# Patient Record
Sex: Female | Born: 2000 | Race: White | Hispanic: No | Marital: Single | Smoking: Never smoker
Health system: Southern US, Community
[De-identification: ages and names within clinical notes are randomized; demographics above are authoritative.]

## PROBLEM LIST (undated history)

## (undated) DIAGNOSIS — R011 Cardiac murmur, unspecified: Secondary | ICD-10-CM

## (undated) HISTORY — PX: NO PAST SURGERIES: SHX2092

## (undated) HISTORY — DX: Cardiac murmur, unspecified: R01.1

---
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
## 2020-01-22 IMAGING — US US ABDOMEN LIMITED
1 series · 14 of 25 positions shown · non-contrast
Comparison: None.

CLINICAL DATA: Right upper quadrant abdominal pain.

EXAM:
ULTRASOUND ABDOMEN LIMITED RIGHT UPPER QUADRANT

[Series 1: us abdomen limited · 0.20mm/px · 14 of 41 slices shown]
[im 1/41]
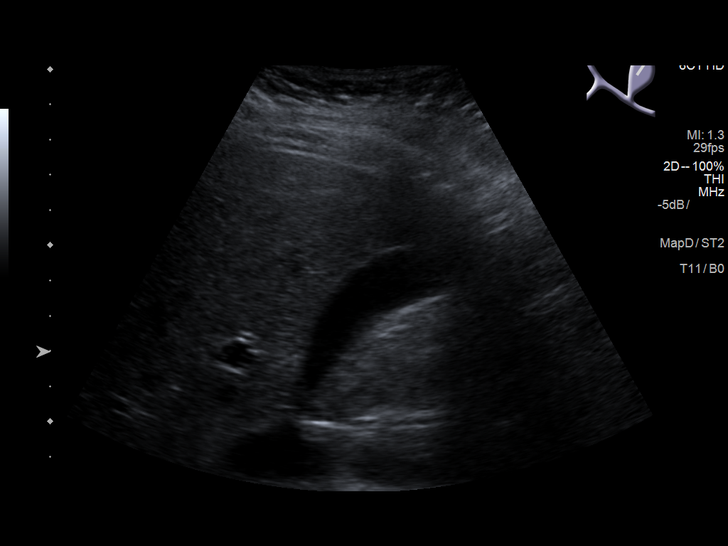
[im 4/41]
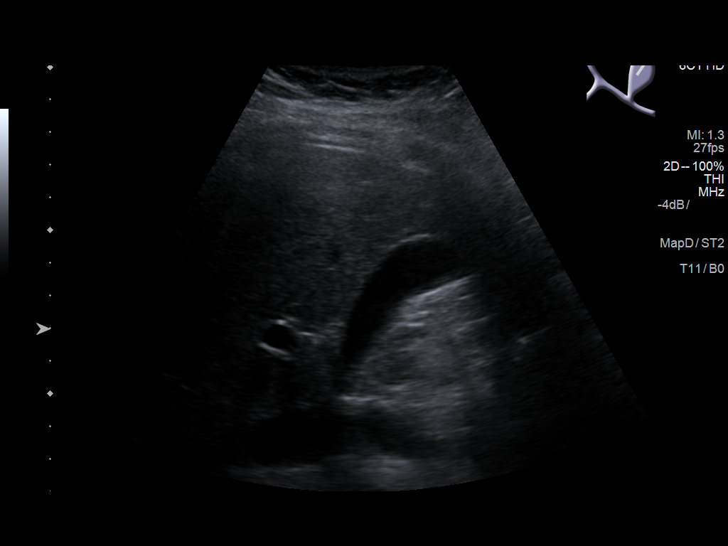
[im 7/41]
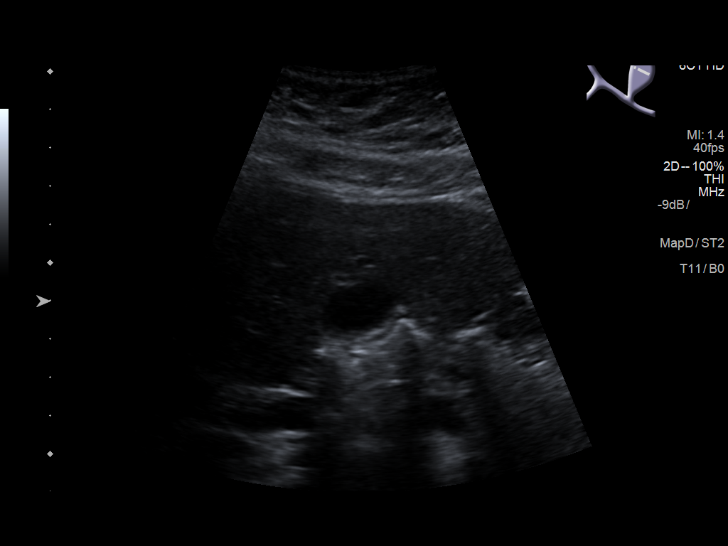
[im 11/41]
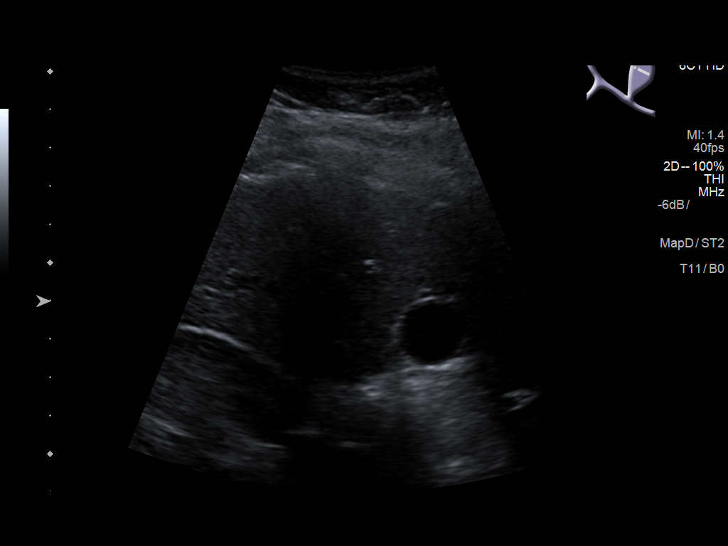
[im 14/41]
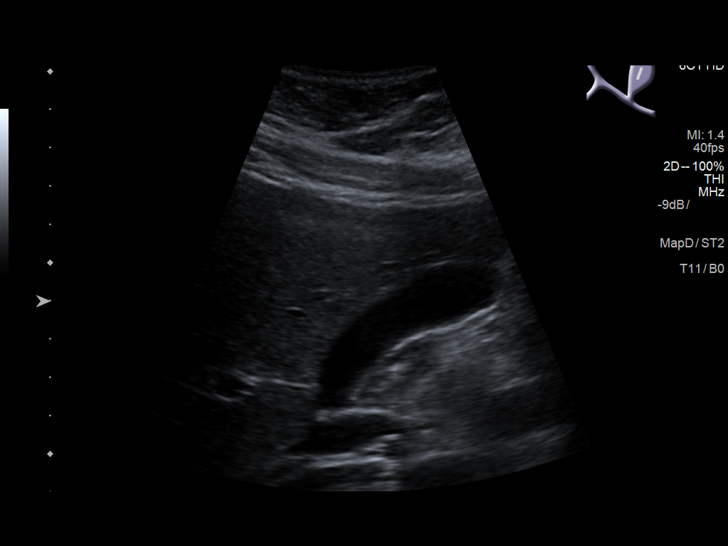
[im 16/41]
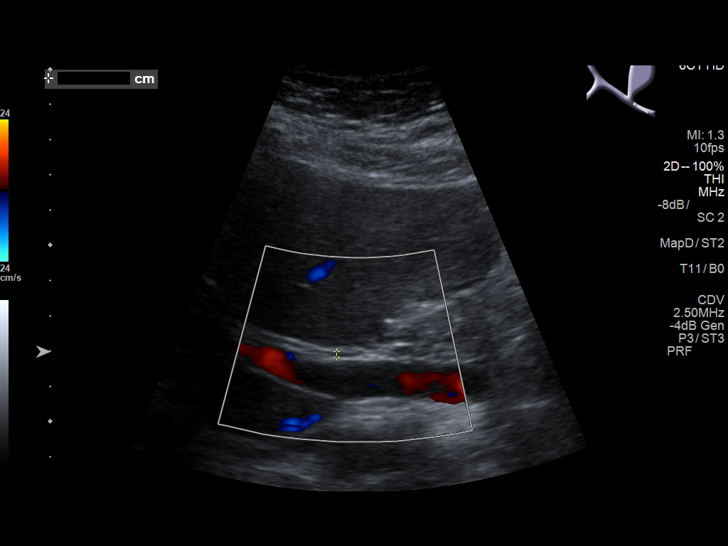
[im 19/41]
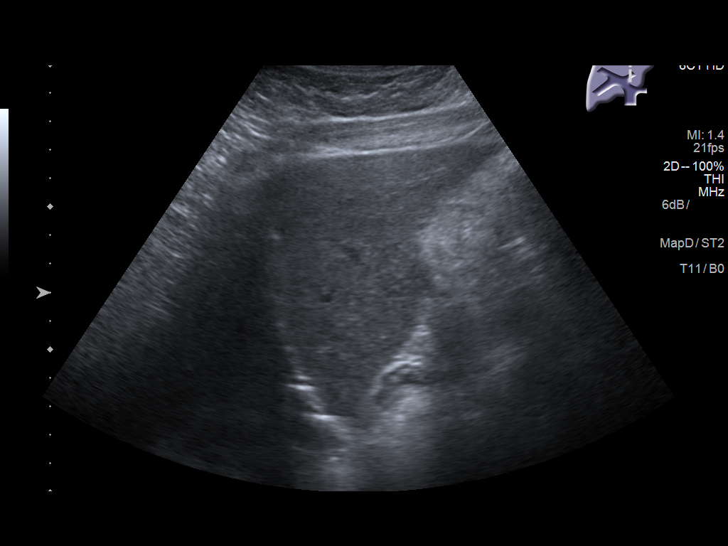
[im 22/41]
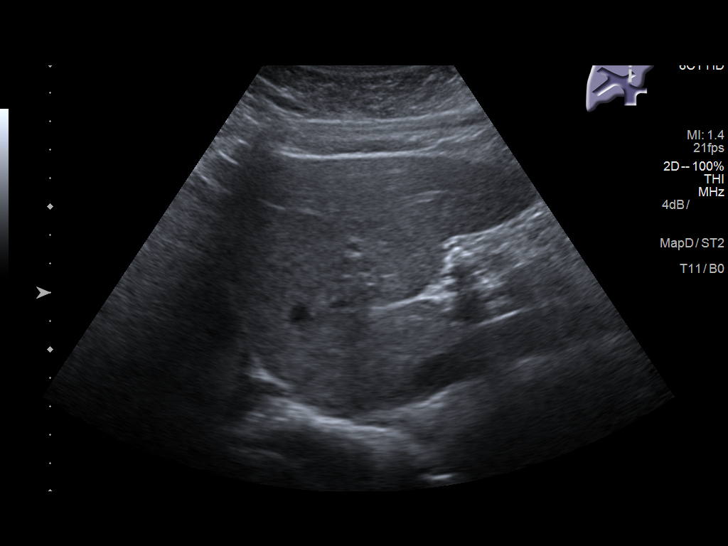
[im 26/41]
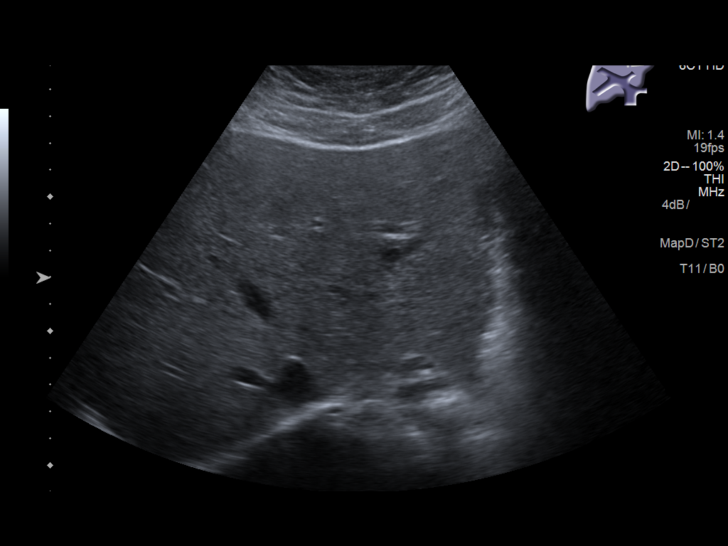
[im 27/41]
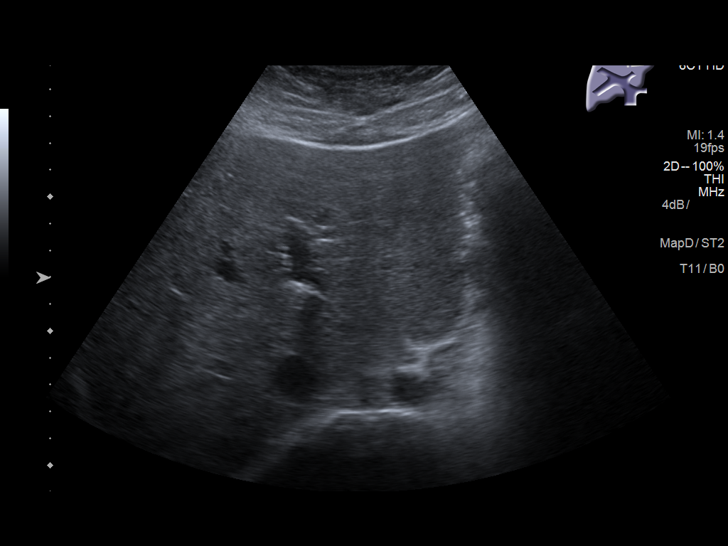
[im 31/41]
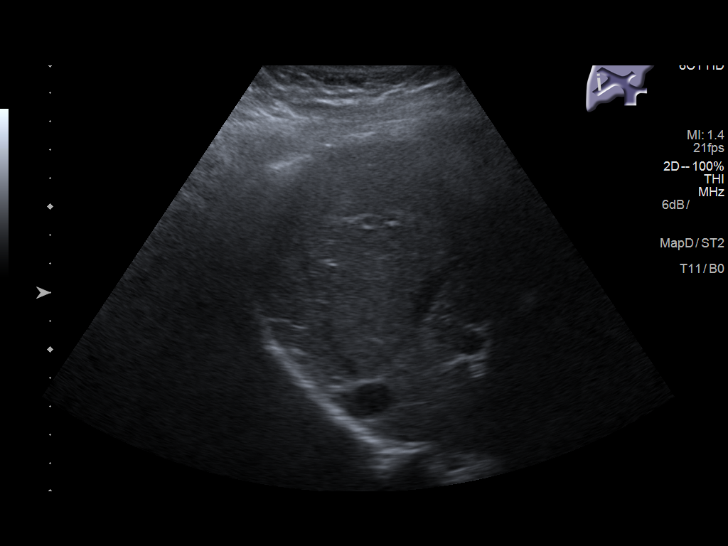
[im 34/41]
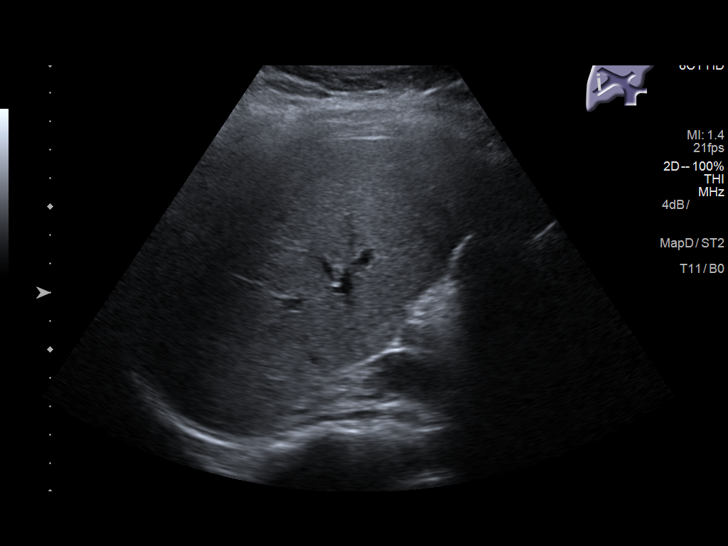
[im 37/41]
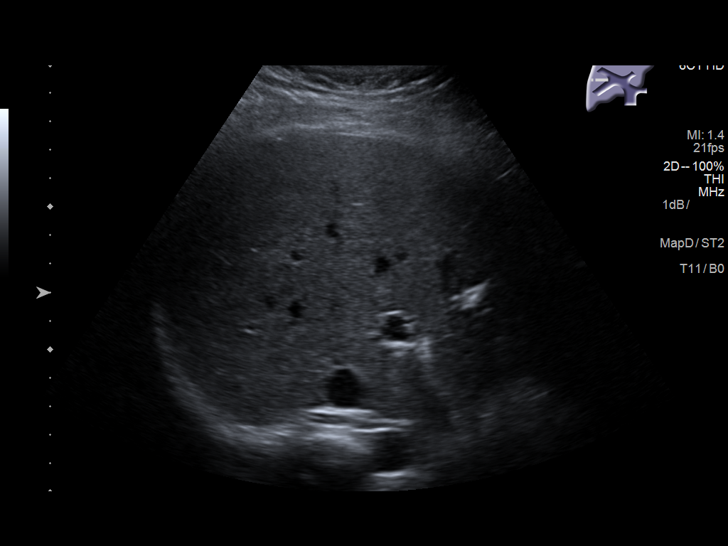
[im 41/41]
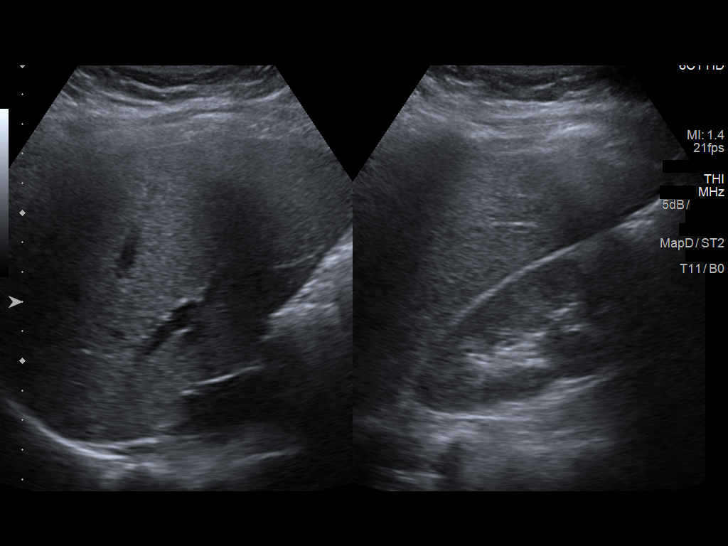

[14 of 25 positions shown; findings below may reference images not displayed]

FINDINGS: Gallbladder:

No gallstones or wall thickening visualized. No sonographic Murphy
sign noted by sonographer.

Common bile duct:

Diameter: 2 mm which is within normal limits.

Liver:

No focal lesion identified. Within normal limits in parenchymal
echogenicity. Portal vein is patent on color Doppler imaging with
normal direction of blood flow towards the liver.
IMPRESSION: No abnormality seen in the right upper quadrant of the abdomen.

## 2020-08-27 ENCOUNTER — Encounter: Payer: Self-pay | Admitting: Obstetrics and Gynecology

## 2020-08-27 ENCOUNTER — Ambulatory Visit (INDEPENDENT_AMBULATORY_CARE_PROVIDER_SITE_OTHER): Payer: Medicaid Other | Admitting: Obstetrics and Gynecology

## 2020-08-27 ENCOUNTER — Other Ambulatory Visit (HOSPITAL_COMMUNITY)
Admission: RE | Admit: 2020-08-27 | Discharge: 2020-08-27 | Disposition: A | Discharge status: Home / Self Care | Payer: Medicaid Other | Source: Ambulatory Visit | Attending: Obstetrics and Gynecology | Admitting: Obstetrics and Gynecology

## 2020-08-27 ENCOUNTER — Other Ambulatory Visit: Payer: Self-pay

## 2020-08-27 VITALS — BP 122/74 | Wt 205.0 lb

## 2020-08-27 DIAGNOSIS — O99212 Obesity complicating pregnancy, second trimester: Secondary | ICD-10-CM

## 2020-08-27 DIAGNOSIS — Z113 Encounter for screening for infections with a predominantly sexual mode of transmission: Secondary | ICD-10-CM | POA: Insufficient documentation

## 2020-08-27 DIAGNOSIS — Z3A20 20 weeks gestation of pregnancy: Secondary | ICD-10-CM

## 2020-08-27 DIAGNOSIS — O0990 Supervision of high risk pregnancy, unspecified, unspecified trimester: Secondary | ICD-10-CM | POA: Diagnosis present

## 2020-08-27 DIAGNOSIS — O0932 Supervision of pregnancy with insufficient antenatal care, second trimester: Secondary | ICD-10-CM

## 2020-08-27 NOTE — Progress Notes (Signed)
New Obstetric Patient H&P   Chief Complaint: "Desires prenatal care"  History of Present Illness: Patient is a 19 y.o. G1P0 Not Hispanic or Latino female, LMP 03/04/2020 presents with amenorrhea and positive home pregnancy test. Per patient report, based on a 12 week ultrasound ([redacted]w[redacted]d on 07/04/2020) her EDD is Estimated Date of Delivery: 01/13/21 and her EGA is [redacted]w[redacted]d.   She had one visit in [place] with Dr. Genoveva Ill in [place] at [facility].  She was [redacted]w[redacted]d pregnant when she saw him.    Since her LMP she claims she has experienced no issues. She denies vaginal bleeding. Her past medical history is noncontributory. This is her first pregnancy.  Since her LMP, she admits to the use of tobacco products  no She claims she has gained 28 pounds since the start of her pregnancy.  There are cats in the home in the home  yes If yes indoor and outdoor. Her fiance changes the litter. She admits close contact with children on a regular basis  yes  She has had chicken pox in the past no She has had Tuberculosis exposures, symptoms, or previously tested positive for TB   no Current or past history of domestic violence. no  Genetic Screening/Teratology Counseling: (Includes patient, baby's father, or anyone in either family with:)   1. Patient's age >/= 99 at [facility]  no 2. Thalassemia ([place], [place], Mediterranean, or Asian background): MCV<80  no 3. Neural tube defect (meningomyelocele, spina bifida, anencephaly)  no 4. Congenital heart defect heart murmurs run in the family, she has never been told why (this is also in the family)  5. Down syndrome  no 6. Tay-Sachs (Jewish, [place]  no 7. Canavan's Disease  no 8. Sickle cell disease or trait (African)  no  9. Hemophilia or other blood disorders  no  10. Muscular dystrophy  no  11. Cystic fibrosis  no  12. Huntington's Chorea  no  13. Mental retardation/autism  no 14. Other inherited genetic or chromosomal disorder   no 15. Maternal metabolic disorder (DM, PKU, etc)  no 16. Patient or FOB with a child with a birth defect not listed above no  16a. Patient or FOB with a birth defect themselves no 17. Recurrent pregnancy loss, or stillbirth  no  18. Any medications since LMP other than prenatal vitamins (include vitamins, supplements, OTC meds, drugs, alcohol)  no 19. Any other genetic/environmental exposure to discuss  no  Infection History:   1. Lives with someone with TB or TB exposed  no  2. Patient or partner has history of genital herpes  no 3. Rash or viral illness since LMP  no 4. History of STI (GC, CT, HPV, syphilis, HIV)  no 5. History of recent travel :  no  Other pertinent information:     Review of Systems:10 point review of systems negative unless otherwise noted in HPI  Past Medical History:  Diagnosis Date  . Heart murmur     Past Surgical History:  Procedure Laterality Date  . NO PAST SURGERIES     Gynecologic History: Patient's last menstrual period was 03/04/2020.  Obstetric History: G1P0  Family History  Problem Relation Age of Onset  . Heart murmur Father   . Hypertension Paternal Grandmother     Social History   Socioeconomic History  . Marital status: Single    Spouse name: Not on file  . Number of children: Not on file  . Years of education: Not on  file  . Highest education level: Not on file  Occupational History  . Not on file  Tobacco Use  . Smoking status: Never Smoker  . Smokeless tobacco: Never Used  Vaping Use  . Vaping Use: Never used  Substance and Sexual Activity  . Alcohol use: No  . Drug use: No  . Sexual activity: Yes    Birth control/protection: None  Other Topics Concern  . Not on file  Social History Narrative  . Not on file   Social Determinants of Health   Financial Resource Strain:   . Difficulty of Paying Living Expenses: Not on file  Food Insecurity:   . Worried About Programme researcher, broadcasting/film/video in the Last Year: Not on file   . Ran Out of Food in the Last Year: Not on file  Transportation Needs:   . Lack of Transportation (Medical): Not on file  . Lack of Transportation (Non-Medical): Not on file  Physical Activity:   . Days of Exercise per Week: Not on file  . Minutes of Exercise per Session: Not on file  Stress:   . Feeling of Stress : Not on file  Social Connections:   . Frequency of Communication with Friends and Family: Not on file  . Frequency of Social Gatherings with Friends and Family: Not on file  . Attends Religious Services: Not on file  . Active Member of Clubs or Organizations: Not on file  . Attends Banker Meetings: Not on file  . Marital Status: Not on file  Intimate Partner Violence:   . Fear of Current or Ex-Partner: Not on file  . Emotionally Abused: Not on file  . Physically Abused: Not on file  . Sexually Abused: Not on file   Allergies: No Known Allergies  Prior to Admission medications: PNV    Physical Exam BP 122/74   Wt 205 lb (93 kg)   LMP 03/04/2020   Physical Exam Constitutional:      General: She is not in acute distress.    Appearance: Normal appearance. She is well-developed.  Genitourinary:     Pelvic exam was performed with patient in the lithotomy position.     Vulva, inguinal canal, urethra, bladder, vagina, uterus, right adnexa and left adnexa normal.     No posterior fourchette tenderness, injury or lesion present.     No cervical friability, lesion, bleeding or polyp.  HENT:     Head: Normocephalic and atraumatic.  Eyes:     General: No scleral icterus.    Conjunctiva/sclera: Conjunctivae normal.  Cardiovascular:     Rate and Rhythm: Normal rate and regular rhythm.     Heart sounds: No murmur heard.  No friction rub. No gallop.   Pulmonary:     Effort: Pulmonary effort is normal. No respiratory distress.     Breath sounds: Normal breath sounds. No wheezing or rales.  Abdominal:     General: Bowel sounds are normal. There is no  distension.     Palpations: Abdomen is soft. There is mass (gravit, fundus at umbilicus).     Tenderness: There is no abdominal tenderness. There is no guarding or rebound.  Musculoskeletal:        General: Normal range of motion.     Cervical back: Normal range of motion and neck supple.  Neurological:     General: No focal deficit present.     Mental Status: She is alert and oriented to person, place, and time.     Cranial  Nerves: No cranial nerve deficit.  Skin:    General: Skin is warm and dry.     Findings: No erythema.  Psychiatric:        Mood and Affect: Mood normal.        Behavior: Behavior normal.        Judgment: Judgment normal.      Female Chaperone present during breast and/or pelvic exam.   Assessment: 19 y.o. G1P0 at [redacted]w[redacted]d presenting to initiate prenatal care  Plan: 1) Avoid alcoholic beverages. 2) Patient encouraged not to smoke.  3) Discontinue the use of all non-medicinal drugs and chemicals.  4) Take prenatal vitamins daily.  5) Nutrition, food safety (fish, cheese advisories, and high nitrite foods) and exercise discussed. 6) Hospital and practice style discussed with cross coverage system.  7) Genetic Screening, such as with 1st Trimester Screening, cell free fetal DNA, AFP testing, and Ultrasound, as well as with amniocentesis and CVS as appropriate, is discussed with patient. At the conclusion of today's visit patient states she has already had genetic testing. Discussed carrier testing and she is interested in this. Will verify genetic screening testing once we receive records from her provider in [place].  8) Patient is asked about travel to areas at risk for the Zika virus, and counseled to avoid travel and exposure to mosquitoes or sexual partners who may have themselves been exposed to the virus. Testing is discussed, and will be ordered as appropriate.   Thomasene Mohair, MD 08/27/2020 8:43 AM

## 2020-08-28 LAB — CERVICOVAGINAL ANCILLARY ONLY
Chlamydia: NEGATIVE
Comment: NEGATIVE
Comment: NEGATIVE
Comment: NORMAL
Neisseria Gonorrhea: NEGATIVE
Trichomonas: NEGATIVE

## 2020-09-02 ENCOUNTER — Ambulatory Visit: Payer: Medicaid Other

## 2020-09-02 ENCOUNTER — Encounter: Payer: Medicaid Other | Admitting: Obstetrics and Gynecology

## 2020-09-02 DIAGNOSIS — O0932 Supervision of pregnancy with insufficient antenatal care, second trimester: Secondary | ICD-10-CM

## 2020-09-02 DIAGNOSIS — O99212 Obesity complicating pregnancy, second trimester: Secondary | ICD-10-CM

## 2020-09-02 DIAGNOSIS — O0990 Supervision of high risk pregnancy, unspecified, unspecified trimester: Secondary | ICD-10-CM

## 2020-09-04 LAB — URINE DRUG PANEL 7
Amphetamines, Urine: NEGATIVE ng/mL
Barbiturate Quant, Ur: NEGATIVE ng/mL
Benzodiazepine Quant, Ur: NEGATIVE ng/mL
Cannabinoid Quant, Ur: POSITIVE — AB
Cocaine (Metab.): NEGATIVE ng/mL
Opiate Quant, Ur: NEGATIVE ng/mL
PCP Quant, Ur: NEGATIVE ng/mL

## 2020-09-04 LAB — URINE CULTURE

## 2020-09-18 ENCOUNTER — Other Ambulatory Visit: Payer: Self-pay | Admitting: Obstetrics and Gynecology

## 2020-09-18 ENCOUNTER — Other Ambulatory Visit: Payer: Self-pay

## 2020-09-18 ENCOUNTER — Ambulatory Visit (INDEPENDENT_AMBULATORY_CARE_PROVIDER_SITE_OTHER): Payer: Medicaid Other | Admitting: Obstetrics

## 2020-09-18 ENCOUNTER — Ambulatory Visit (INDEPENDENT_AMBULATORY_CARE_PROVIDER_SITE_OTHER): Payer: Medicaid Other

## 2020-09-18 VITALS — BP 126/74 | Wt 205.0 lb

## 2020-09-18 DIAGNOSIS — O0990 Supervision of high risk pregnancy, unspecified, unspecified trimester: Secondary | ICD-10-CM

## 2020-09-18 DIAGNOSIS — O99212 Obesity complicating pregnancy, second trimester: Secondary | ICD-10-CM | POA: Diagnosis not present

## 2020-09-18 DIAGNOSIS — Z3686 Encounter for antenatal screening for cervical length: Secondary | ICD-10-CM

## 2020-09-18 DIAGNOSIS — Z3A25 25 weeks gestation of pregnancy: Secondary | ICD-10-CM

## 2020-09-18 DIAGNOSIS — O0932 Supervision of pregnancy with insufficient antenatal care, second trimester: Secondary | ICD-10-CM

## 2020-09-18 NOTE — Progress Notes (Signed)
Routine Prenatal Care Visit  Subjective  Christie Nguyen is a 19 y.o. G1P0 at [redacted]w[redacted]d being seen today for ongoing prenatal care.  She is currently monitored for the following issues for this high-risk pregnancy and has Supervision of high risk pregnancy, antepartum on their problem list.  ----------------------------------------------------------------------------------- Patient reports no complaints.  She announces that she is moving back to [place] to live with her father and step mother. Reports hx of anxiety, for which she is smoking MJ. Offered option of exploring SSRI medication she declines. Advised to exercise, avoif MJ, and get more sunshine to address her anxiety.  .  .   Christie Nguyen Fluid denies.  ----------------------------------------------------------------------------------- The following portions of the patient's history were reviewed and updated as appropriate: allergies, current medications, past family history, past medical history, past social history, past surgical history and problem list. Problem list updated.  Objective  Blood pressure 126/74, weight 205 lb (93 kg), last menstrual period 03/04/2020. Pregravid weight 177 lb (80.3 kg) Total Weight Gain 28 lb (12.7 kg) Urinalysis: Urine Protein    Urine Glucose    Fetal Status:           General:  Alert, oriented and cooperative. Patient is in no acute distress.  Skin: Skin is warm and dry. No rash noted.   Cardiovascular: Normal heart rate noted  Respiratory: Normal respiratory effort, no problems with respiration noted  Abdomen: Soft, gravid, appropriate for gestational age.       Pelvic:  Cervical exam deferred        Extremities: Normal range of motion.     Mental Status: Normal mood and affect. Normal behavior. Normal judgment and thought content.   Assessment   19 y.o. G1P0 at [redacted]w[redacted]d by  01/13/2021, by Ultrasound presenting for routine prenatal visit  Plan   pregnancy Problems (from 08/27/20 to present)     Problem Noted Resolved   Supervision of high risk pregnancy, antepartum 08/27/2020 by Conard Novak, MD No   Overview Signed 09/18/2020  1:53 PM by Mirna Mires, CNM     Clinic  Prenatal Labs  Dating  CW LMP Blood type:     Genetic Screen 1 Screen:    AFP:     Quad:     NIPS: Antibody:   Anatomic [place]  Rubella:    GTT Early:               Third trimester:  RPR:     Flu vaccine  HBsAg:     TDaP vaccine                                               Rhogam: HIV:     Baby Food                                               GBS: (For PCN allergy, check sensitivities)  Contraception  Pap:  Circumcision    Pediatrician    Support Person                  Preterm labor symptoms and general obstetric precautions including but not limited to vaginal bleeding, contractions, leaking of fluid and fetal movement were reviewed in detail with the patient. Please  refer to After Visit Summary for other counseling recommendations.  Her OB blood work is drawn today. We reviewed her anatomy ultrasound today as well. Addressed her Marijuana use. And advised her NOT ot use MJ.   Return in about 3 weeks (around 10/09/2020) for return OB.  Mirna Mires, CNM  09/18/2020 1:54 PM

## 2020-09-18 NOTE — Progress Notes (Signed)
No vb. No lof. Anatomy scan today. 

## 2020-09-23 IMAGING — US US ABDOMEN COMPLETE
1 series · 14 of 25 positions shown · non-contrast
Comparison: 11/29/2017

CLINICAL DATA: Right upper quadrant abdominal pain and nausea for 1
year.

EXAM:
ABDOMEN ULTRASOUND COMPLETE

[Series 1: us abdomen complete · 0.19mm/px · 14 of 104 slices shown]
[im 1/104]
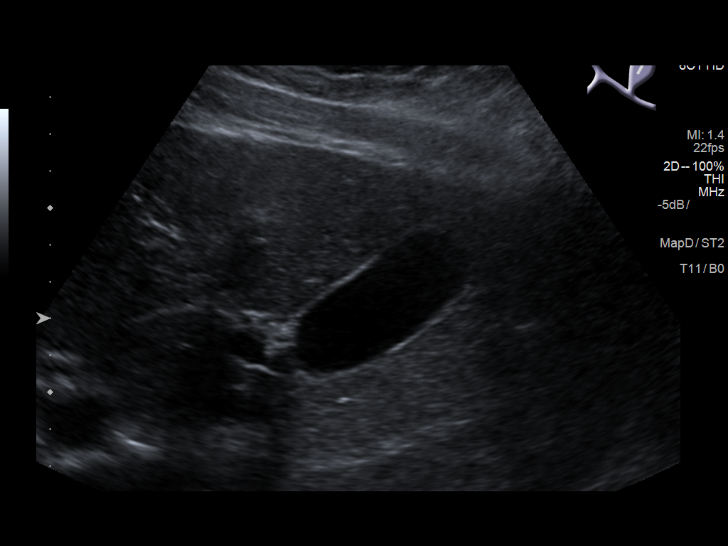
[im 9/104]
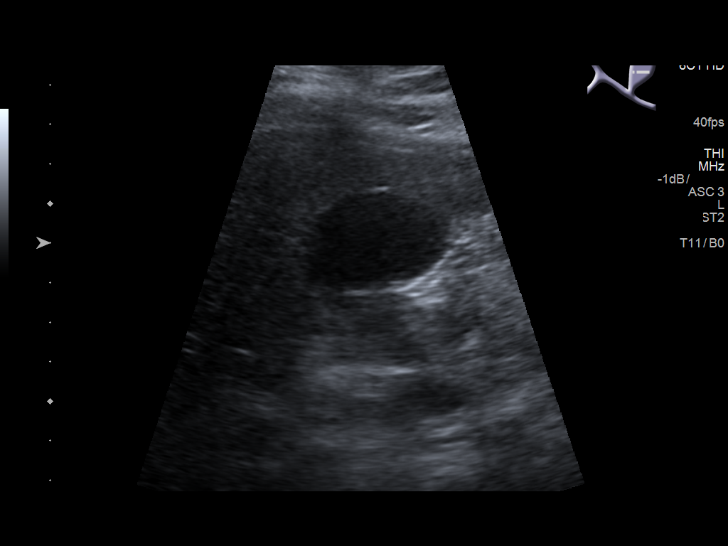
[im 18/104]
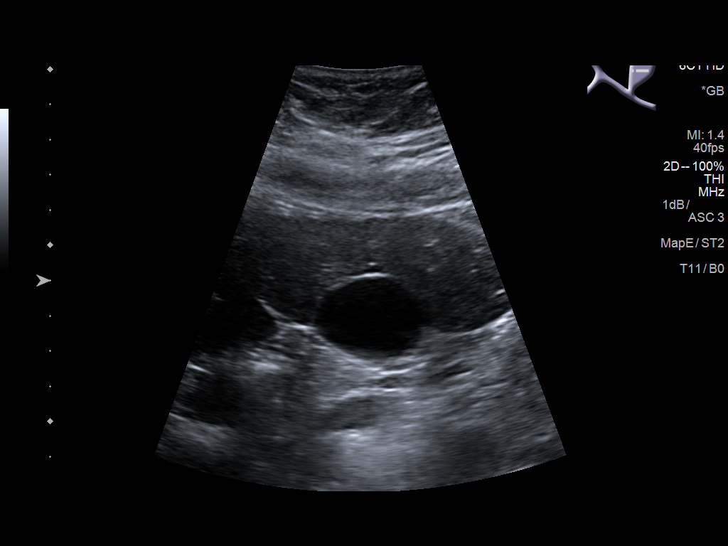
[im 26/104]
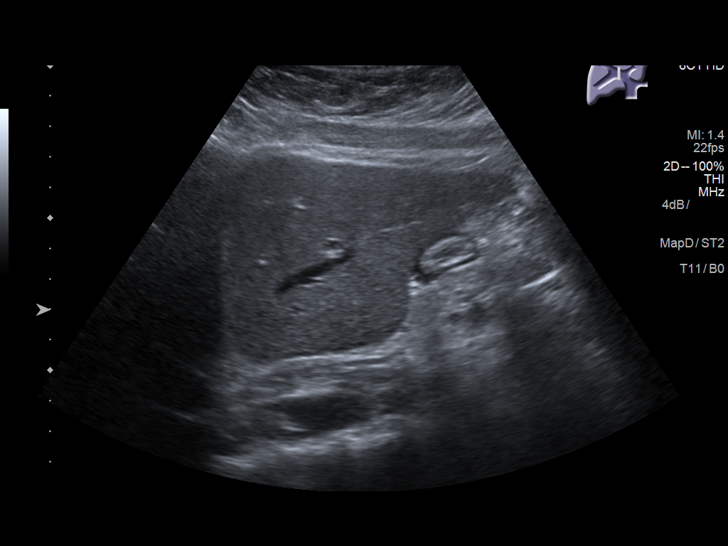
[im 35/104]
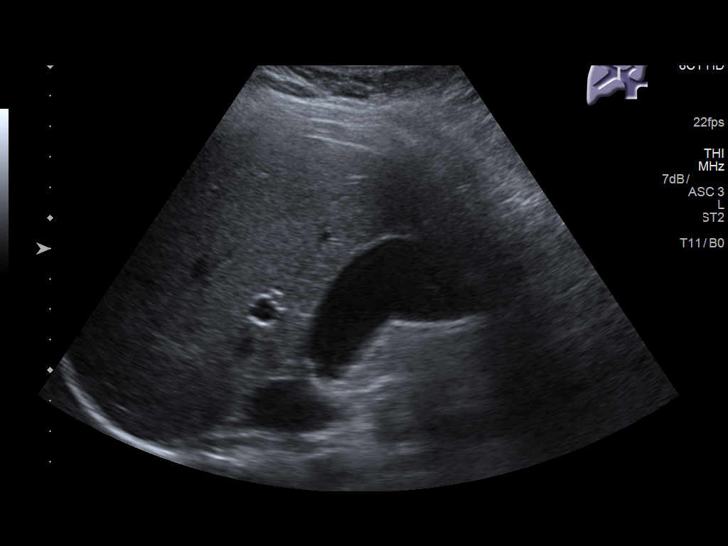
[im 39/104]
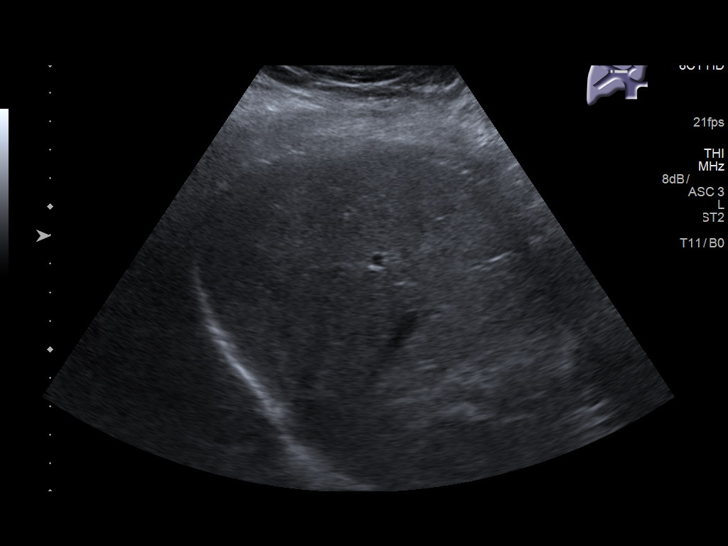
[im 48/104]
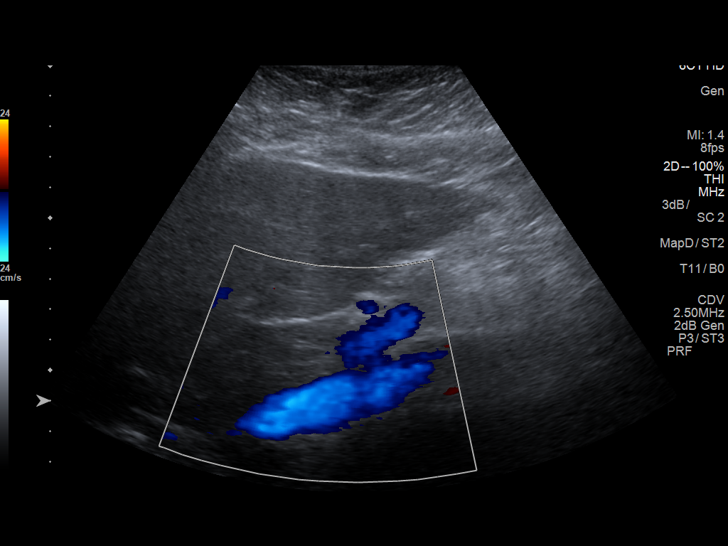
[im 56/104]
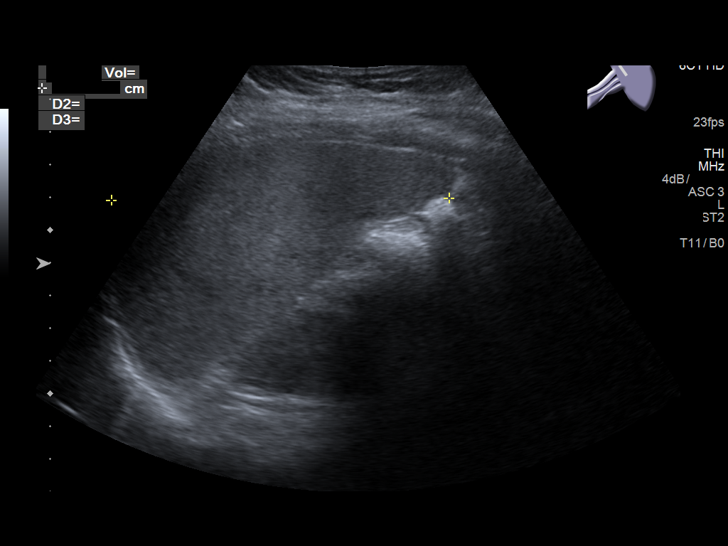
[im 65/104]
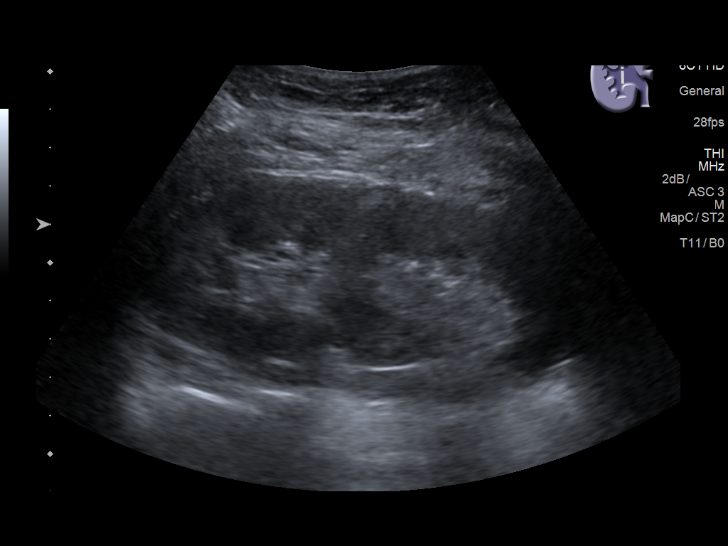
[im 69/104]
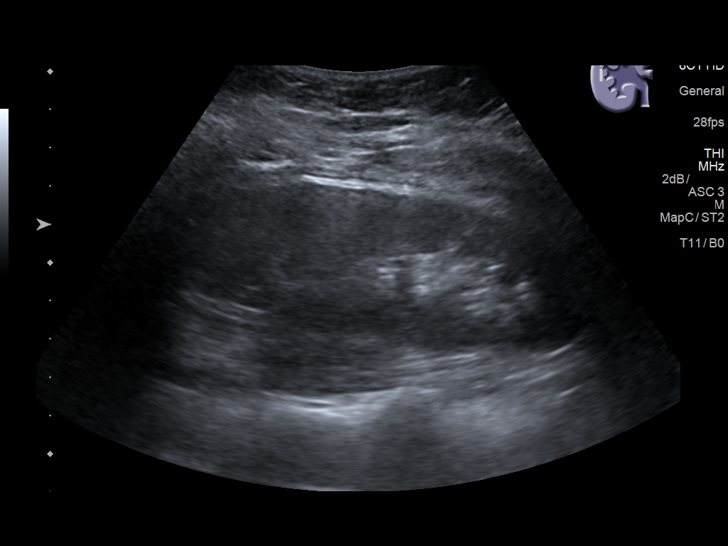
[im 78/104]
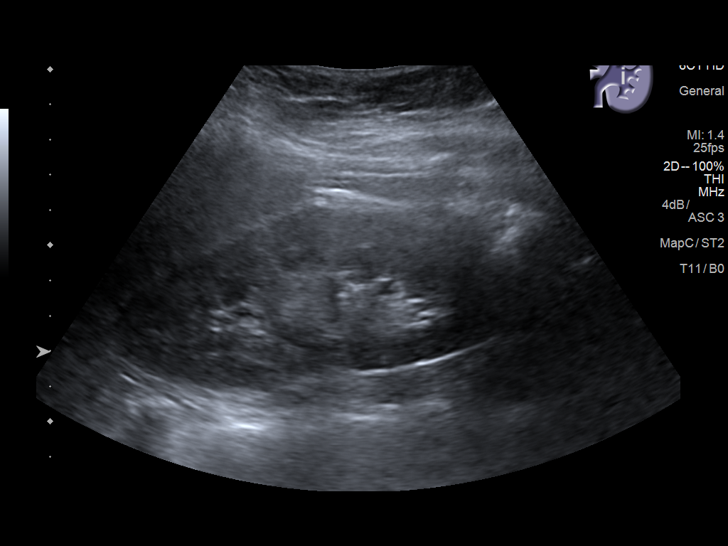
[im 86/104]
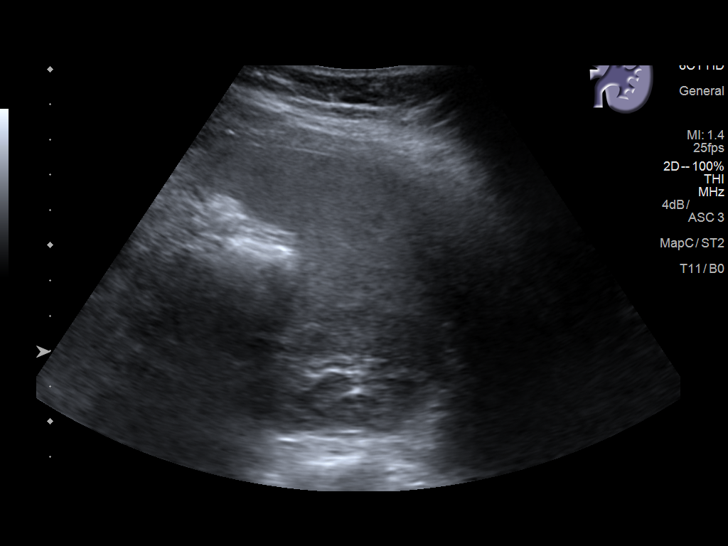
[im 95/104]
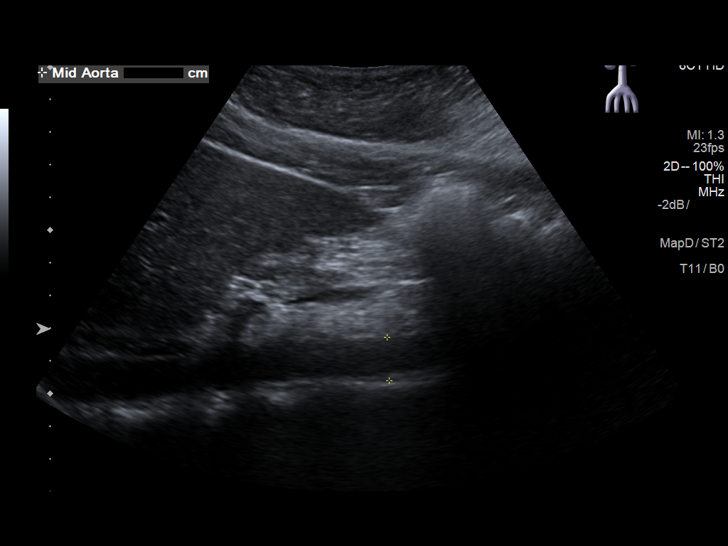
[im 104/104]
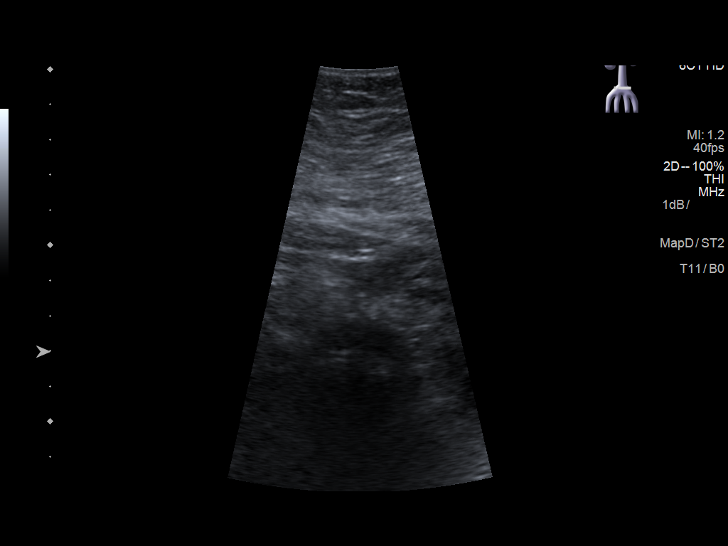

[14 of 25 positions shown; findings below may reference images not displayed]

FINDINGS: Gallbladder: No gallstones or wall thickening visualized. No
sonographic Murphy sign noted by sonographer.

Common bile duct: Diameter: 2 mm, within normal limits.

Liver: No focal lesion identified. Within normal limits in
parenchymal echogenicity. Portal vein is patent on color Doppler
imaging with normal direction of blood flow towards the liver.

IVC: No abnormality visualized.

Pancreas: Visualized portion unremarkable.

Spleen: Size and appearance within normal limits.

Right Kidney: Length: 11.2 cm. Echogenicity within normal limits. No
mass or hydronephrosis visualized.

Left Kidney: Length: 11.1 cm. Echogenicity within normal limits. No
mass or hydronephrosis visualized.

Abdominal aorta: No aneurysm visualized.

Other findings: None.
IMPRESSION: Normal abdomen ultrasound.

## 2021-08-15 ENCOUNTER — Other Ambulatory Visit: Payer: Self-pay | Admitting: Nurse Practitioner

## 2021-08-15 ENCOUNTER — Other Ambulatory Visit (HOSPITAL_BASED_OUTPATIENT_CLINIC_OR_DEPARTMENT_OTHER): Payer: Self-pay | Admitting: Nurse Practitioner

## 2021-08-15 DIAGNOSIS — G8929 Other chronic pain: Secondary | ICD-10-CM

## 2021-09-09 ENCOUNTER — Emergency Department
Admission: EM | Admit: 2021-09-09 | Discharge: 2021-09-09 | Disposition: A | Discharge status: Home / Self Care | Payer: Medicaid Other | Attending: Emergency Medicine | Admitting: Emergency Medicine

## 2021-09-09 ENCOUNTER — Other Ambulatory Visit: Payer: Self-pay

## 2021-09-09 ENCOUNTER — Encounter: Payer: Self-pay | Admitting: Emergency Medicine

## 2021-09-09 DIAGNOSIS — Z20822 Contact with and (suspected) exposure to covid-19: Secondary | ICD-10-CM | POA: Insufficient documentation

## 2021-09-09 DIAGNOSIS — J101 Influenza due to other identified influenza virus with other respiratory manifestations: Secondary | ICD-10-CM | POA: Diagnosis not present

## 2021-09-09 DIAGNOSIS — R509 Fever, unspecified: Secondary | ICD-10-CM | POA: Diagnosis present

## 2021-09-09 LAB — RESP PANEL BY RT-PCR (FLU A&B, COVID) ARPGX2
Influenza A by PCR: POSITIVE — AB
Influenza B by PCR: NEGATIVE
SARS Coronavirus 2 by RT PCR: NEGATIVE

## 2021-09-09 MED ORDER — OSELTAMIVIR PHOSPHATE 75 MG PO CAPS: 75.0000 mg | ORAL_CAPSULE | Freq: Two times a day (BID) | ORAL | 0 refills | Status: AC

## 2021-09-09 MED ORDER — ONDANSETRON 8 MG PO TBDP: 8.0000 mg | ORAL_TABLET | Freq: Three times a day (TID) | ORAL | 0 refills | Status: DC | PRN

## 2021-09-09 NOTE — Discharge Instructions (Addendum)
In addition to the prescribed medications, take the Tamiflu for the full 5-day course.  You may take the Zofran (ondansetron) for nausea or vomiting.  In addition, you can take over-the-counter Tylenol or ibuprofen for body aches and fever.

## 2021-09-09 NOTE — ED Triage Notes (Signed)
C/O chills and vomiting.  States "I need to be checked out for the flu".  Symptoms started on Sunday.  AAOx3.  Skin warm and dry. NAD

## 2021-09-09 NOTE — ED Provider Notes (Signed)
[facility] Emergency Department Provider Note ____________________________________________   Event Date/Time   First MD Initiated Contact with Patient 09/09/21 1732     (approximate)  I have reviewed the triage vital signs and the nursing notes.   HISTORY  Chief Complaint Influenza    HPI Christie Nguyen is a 20 y.o. female with no active medical problems who presents with subjective fever and chills, body aches, vomiting, diarrhea, cough, and shortness of breath over the last 2 days.  She has several family members were also sick with similar symptoms.  She is concerned that she has influenza.  She has not taken anything for it at home.   Past Medical History:  Diagnosis Date   Heart murmur     Patient Active Problem List   Diagnosis Date Noted   Supervision of high risk pregnancy, antepartum 08/27/2020    Past Surgical History:  Procedure Laterality Date   NO PAST SURGERIES      Prior to Admission medications   Medication Sig Start Date End Date Taking? Authorizing Provider  ondansetron (ZOFRAN-ODT) 8 MG disintegrating tablet Take 1 tablet (8 mg total) by mouth every 8 (eight) hours as needed for nausea or vomiting. 09/09/21  Yes Dionne Bucy, MD  oseltamivir (TAMIFLU) 75 MG capsule Take 1 capsule (75 mg total) by mouth 2 (two) times daily for 5 days. 09/09/21 09/14/21 Yes Dionne Bucy, MD  famotidine (PEPCID) 20 MG tablet Take 1 tablet (20 mg total) by mouth 2 (two) times daily. Patient not taking: Reported on 08/27/2020 11/29/17   Emily Filbert, MD  sucralfate (CARAFATE) 1 g tablet Take 1 tablet (1 g total) by mouth 4 (four) times daily. 11/29/17 11/29/18  Emily Filbert, MD    Allergies Patient has no known allergies.  Family History  Problem Relation Age of Onset   Heart murmur Father    Hypertension Paternal Grandmother     Social History Social History   Tobacco Use   Smoking status: Never   Smokeless  tobacco: Never  Vaping Use   Vaping Use: Never used  Substance Use Topics   Alcohol use: No   Drug use: No    Review of Systems  Constitutional: Positive for subjective fever. Eyes: No redness. ENT: Positive for nasal congestion. Cardiovascular: Denies chest pain. Respiratory: Positive for shortness of breath. Gastrointestinal: Positive for vomiting and diarrhea.  Genitourinary: Negative for dysuria.  Musculoskeletal: Negative for back pain. Skin: Negative for rash. Neurological: Positive for headache.   ____________________________________________   PHYSICAL EXAM:  VITAL SIGNS: ED Triage Vitals  Enc Vitals Group     BP 09/09/21 1754 134/72     Pulse Rate 09/09/21 1753 74     Resp 09/09/21 1753 20     Temp 09/09/21 1753 98.9 F (37.2 C)     Temp Source 09/09/21 1753 Oral     SpO2 09/09/21 1753 99 %     Weight 09/09/21 1716 205 lb 0.4 oz (93 kg)     Height 09/09/21 1716 5\' 3"  (1.6 m)     Head Circumference --      Peak Flow --      Pain Score 09/09/21 1716 0     Pain Loc --      Pain Edu? --      Excl. in GC? --     Constitutional: Alert and oriented. Well appearing and in no acute distress. Eyes: Conjunctivae are normal.  Head: Atraumatic. Nose: No congestion/rhinnorhea. Mouth/Throat: Mucous membranes  are moist.  Oropharynx clear with slight erythema but no exudates. Neck: Normal range of motion.  Cardiovascular: Normal rate, regular rhythm. Grossly normal heart sounds.  Good peripheral circulation. Respiratory: Normal respiratory effort.  No retractions. Lungs CTAB. Gastrointestinal: No distention.  Musculoskeletal: Extremities warm and well perfused.  Neurologic:  Normal speech and language. No gross focal neurologic deficits are appreciated.  Skin:  Skin is warm and dry. No rash noted. Psychiatric: Mood and affect are normal. Speech and behavior are normal.  ____________________________________________   LABS (all labs ordered are listed, but only  abnormal results are displayed)  Labs Reviewed  RESP PANEL BY RT-PCR (FLU A&B, COVID) ARPGX2 - Abnormal; Notable for the following components:      Result Value   Influenza A by PCR POSITIVE (*)    All other components within normal limits   ____________________________________________  EKG  ____________________________________________  RADIOLOGY    ____________________________________________   PROCEDURES  Procedure(s) performed: No  Procedures  Critical Care performed: No ____________________________________________   INITIAL IMPRESSION / ASSESSMENT AND PLAN / ED COURSE  Pertinent labs & imaging results that were available during my care of the patient were reviewed by me and considered in my medical decision making (see chart for details).   20 year old female with no active medical problems presents with subjective fever and chills, body aches, cough, shortness of breath, and intermittent vomiting over the last 2 days.  On exam she is overall well-appearing and her vital signs are normal.  Lungs are clear to auscultation.  Presentation is consistent with influenza, COVID-19, or other viral etiology.  We will obtain a respiratory panel and plan for symptomatic treatment.    ----------------------------------------- 7:05 PM on 09/09/2021 -----------------------------------------  The patient is positive for influenza A.  She is stable for discharge home.  I counseled her on the results of the respiratory panel.  I will prescribe Tamiflu since the patient has had symptoms for 2 days, as well as ODT Zofran and have instructed her on using ibuprofen or Tylenol for fever and body aches.  I gave her thorough return precautions and she expressed understanding.  ____________________________________________   FINAL CLINICAL IMPRESSION(S) / ED DIAGNOSES  Final diagnoses:  Influenza A      NEW MEDICATIONS STARTED DURING THIS VISIT:  New Prescriptions   ONDANSETRON  (ZOFRAN-ODT) 8 MG DISINTEGRATING TABLET    Take 1 tablet (8 mg total) by mouth every 8 (eight) hours as needed for nausea or vomiting.   OSELTAMIVIR (TAMIFLU) 75 MG CAPSULE    Take 1 capsule (75 mg total) by mouth 2 (two) times daily for 5 days.     Note:  This document was prepared using Dragon voice recognition software and may include unintentional dictation errors.    Dionne Bucy, MD 09/09/21 [phone]

## 2021-10-19 NOTE — L&D Delivery Note (Signed)
Delivery Note  Date of delivery: 05/06/22 Estimated Date of Delivery: None noted. No LMP recorded. Patient is pregnant. EGA: term  Delivery Note At 6:31 PM a viable female was delivered via  SVD, pt was at home and unattended. EMS arrived and assigned apgar. APGAR: , 9; weight 7 lb 6.5 oz (3360 g).    Cord blood sample collected from placenta.    Third Stage: Placenta delivered prior to arrival; pt examined and noted trailing membranes with moderate lochia and small clots. Fundus massaged firm, membranes teased out. Moderate lochia with clots continued, boggy uterusLUS inspected, clots evacuated, then massaged firm. PItocin bolus initiated, methergine 0.2mg  IM given.  Placenta disposition: inspected and noted to be intact.   2nd deg perineal laceration identified  Anesthesia for repair: local Repair with 2-0 Vicryl CT-1 Est. Blood Loss (mL): 200  Complications: no PNC, initial BP elevated on arrival to unit.   Mom to postpartum.  Baby to Couplet care / Skin to Skin.   Christie Nguyen 05/06/2022, 8:40 PM

## 2022-05-06 ENCOUNTER — Inpatient Hospital Stay
Admission: EM | Admit: 2022-05-06 | Discharge: 2022-05-08 | DRG: 769 | Disposition: A | Discharge status: Home / Self Care | Payer: Medicaid Other | Attending: Obstetrics and Gynecology | Admitting: Obstetrics and Gynecology

## 2022-05-06 DIAGNOSIS — D62 Acute posthemorrhagic anemia: Secondary | ICD-10-CM

## 2022-05-06 DIAGNOSIS — O0933 Supervision of pregnancy with insufficient antenatal care, third trimester: Secondary | ICD-10-CM

## 2022-05-06 DIAGNOSIS — O9081 Anemia of the puerperium: Secondary | ICD-10-CM | POA: Diagnosis present

## 2022-05-06 DIAGNOSIS — Z23 Encounter for immunization: Secondary | ICD-10-CM

## 2022-05-06 LAB — COMPREHENSIVE METABOLIC PANEL
ALT: 15 U/L (ref 0–44)
AST: 20 U/L (ref 15–41)
Albumin: 3 g/dL — ABNORMAL LOW (ref 3.5–5.0)
Alkaline Phosphatase: 171 U/L — ABNORMAL HIGH (ref 38–126)
Anion gap: 7 (ref 5–15)
BUN: 10 mg/dL (ref 6–20)
CO2: 21 mmol/L — ABNORMAL LOW (ref 22–32)
Calcium: 8.7 mg/dL — ABNORMAL LOW (ref 8.9–10.3)
Chloride: 108 mmol/L (ref 98–111)
Creatinine, Ser: 0.63 mg/dL (ref 0.44–1.00)
GFR, Estimated: >60 mL/min (ref >60)
Glucose, Bld: 107 mg/dL — ABNORMAL HIGH (ref 70–99)
Potassium: 3.7 mmol/L (ref 3.5–5.1)
Sodium: 136 mmol/L (ref 135–145)
Total Bilirubin: 0.5 mg/dL (ref 0.3–1.2)
Total Protein: 6.7 g/dL (ref 6.5–8.1)

## 2022-05-06 LAB — ABO/RH: ABO/RH(D): O POS

## 2022-05-06 LAB — CBC
HCT: 36.5 % (ref 36.0–46.0)
Hemoglobin: 12.1 g/dL (ref 12.0–15.0)
MCH: 28.1 pg (ref 26.0–34.0)
MCHC: 33.2 g/dL (ref 30.0–36.0)
MCV: 84.9 fL (ref 80.0–100.0)
Platelets: 216 10*3/uL (ref 150–400)
RBC: 4.3 MIL/uL (ref 3.87–5.11)
RDW: 13.8 % (ref 11.5–15.5)
WBC: 12.6 10*3/uL — ABNORMAL HIGH (ref 4.0–10.5)
nRBC: 0 % (ref 0.0–0.2)

## 2022-05-06 LAB — TYPE AND SCREEN
ABO/RH(D): O POS
Antibody Screen: NEGATIVE

## 2022-05-06 LAB — RAPID HIV SCREEN (HIV 1/2 AB+AG)
HIV 1/2 Antibodies: NONREACTIVE
HIV-1 P24 Antigen - HIV24: NONREACTIVE

## 2022-05-06 MED ORDER — ONDANSETRON HCL 4 MG/2ML IJ SOLN: 4.0000 mg | Freq: Four times a day (QID) | INTRAMUSCULAR | Status: DC | PRN

## 2022-05-06 MED ORDER — OXYTOCIN-SODIUM CHLORIDE 30-0.9 UT/500ML-% IV SOLN
2.5000 [IU]/h | INTRAVENOUS | Status: DC
Start: 2022-05-06 — End: 2022-05-06

## 2022-05-06 MED ORDER — OXYCODONE-ACETAMINOPHEN 5-325 MG PO TABS: 1.0000 | ORAL_TABLET | ORAL | Status: DC | PRN

## 2022-05-06 MED ORDER — ONDANSETRON HCL 4 MG/2ML IJ SOLN: 4.0000 mg | INTRAMUSCULAR | Status: DC | PRN

## 2022-05-06 MED ORDER — FENTANYL CITRATE (PF) 100 MCG/2ML IJ SOLN
50.0000 ug | Freq: Once | INTRAMUSCULAR | Status: AC
  Administered 2022-05-06: 50 ug via INTRAVENOUS

## 2022-05-06 MED ORDER — DIBUCAINE (PERIANAL) 1 % EX OINT
1.0000 | TOPICAL_OINTMENT | CUTANEOUS | Status: DC | PRN
  Administered 2022-05-07: 1 via RECTAL
  Filled 2022-05-06: qty 28

## 2022-05-06 MED ORDER — DIPHENHYDRAMINE HCL 25 MG PO CAPS: 25.0000 mg | ORAL_CAPSULE | Freq: Four times a day (QID) | ORAL | Status: DC | PRN

## 2022-05-06 MED ORDER — OXYTOCIN BOLUS FROM INFUSION: 333.0000 mL | Freq: Once | INTRAVENOUS | Status: DC

## 2022-05-06 MED ORDER — TETANUS-DIPHTH-ACELL PERTUSSIS 5-2.5-18.5 LF-MCG/0.5 IM SUSY
0.5000 mL | PREFILLED_SYRINGE | Freq: Once | INTRAMUSCULAR | Status: AC
  Administered 2022-05-08: 0.5 mL via INTRAMUSCULAR
  Filled 2022-05-06: qty 0.5

## 2022-05-06 MED ORDER — ACETAMINOPHEN 325 MG PO TABS: 650.0000 mg | ORAL_TABLET | ORAL | Status: DC | PRN

## 2022-05-06 MED ORDER — SOD CITRATE-CITRIC ACID 500-334 MG/5ML PO SOLN: 30.0000 mL | ORAL | Status: DC | PRN

## 2022-05-06 MED ORDER — KETOROLAC TROMETHAMINE 30 MG/ML IJ SOLN
INTRAMUSCULAR | Status: AC
  Filled 2022-05-06: qty 1

## 2022-05-06 MED ORDER — LACTATED RINGERS IV SOLN: 500.0000 mL | INTRAVENOUS | Status: DC | PRN

## 2022-05-06 MED ORDER — IBUPROFEN 600 MG PO TABS
600.0000 mg | ORAL_TABLET | Freq: Four times a day (QID) | ORAL | Status: DC
  Administered 2022-05-07 – 2022-05-08 (×6): 600 mg via ORAL
  Filled 2022-05-06 (×6): qty 1

## 2022-05-06 MED ORDER — ZOLPIDEM TARTRATE 5 MG PO TABS: 5.0000 mg | ORAL_TABLET | Freq: Every evening | ORAL | Status: DC | PRN

## 2022-05-06 MED ORDER — SENNOSIDES-DOCUSATE SODIUM 8.6-50 MG PO TABS
2.0000 | ORAL_TABLET | Freq: Every day | ORAL | Status: DC
  Administered 2022-05-07 – 2022-05-08 (×2): 2 via ORAL
  Filled 2022-05-06 (×2): qty 2

## 2022-05-06 MED ORDER — PRENATAL MULTIVITAMIN CH
1.0000 | ORAL_TABLET | Freq: Every day | ORAL | Status: DC
  Administered 2022-05-07 – 2022-05-08 (×2): 1 via ORAL
  Filled 2022-05-06 (×2): qty 1

## 2022-05-06 MED ORDER — LIDOCAINE HCL (PF) 1 % IJ SOLN: 30.0000 mL | INTRAMUSCULAR | Status: DC | PRN

## 2022-05-06 MED ORDER — METHYLERGONOVINE MALEATE 0.2 MG/ML IJ SOLN
INTRAMUSCULAR | Status: AC
  Filled 2022-05-06: qty 1

## 2022-05-06 MED ORDER — LACTATED RINGERS IV SOLN: INTRAVENOUS | Status: DC

## 2022-05-06 MED ORDER — OXYTOCIN-SODIUM CHLORIDE 30-0.9 UT/500ML-% IV SOLN: 2.5000 [IU]/h | INTRAVENOUS | Status: DC

## 2022-05-06 MED ORDER — COCONUT OIL OIL: 1.0000 | TOPICAL_OIL | Status: DC | PRN

## 2022-05-06 MED ORDER — SIMETHICONE 80 MG PO CHEW: 80.0000 mg | CHEWABLE_TABLET | ORAL | Status: DC | PRN

## 2022-05-06 MED ORDER — FENTANYL CITRATE (PF) 100 MCG/2ML IJ SOLN
INTRAMUSCULAR | Status: AC
  Filled 2022-05-06: qty 2

## 2022-05-06 MED ORDER — BENZOCAINE-MENTHOL 20-0.5 % EX AERO
1.0000 | INHALATION_SPRAY | CUTANEOUS | Status: DC | PRN
  Administered 2022-05-07: 1 via TOPICAL
  Filled 2022-05-06: qty 56

## 2022-05-06 MED ORDER — METHYLERGONOVINE MALEATE 0.2 MG/ML IJ SOLN
0.2000 mg | Freq: Once | INTRAMUSCULAR | Status: AC
Start: 2022-05-06 — End: 2022-05-06
  Administered 2022-05-06: 0.2 mg via INTRAMUSCULAR

## 2022-05-06 MED ORDER — OXYCODONE-ACETAMINOPHEN 5-325 MG PO TABS: 2.0000 | ORAL_TABLET | ORAL | Status: DC | PRN

## 2022-05-06 MED ORDER — WITCH HAZEL-GLYCERIN EX PADS
1.0000 | MEDICATED_PAD | CUTANEOUS | Status: DC | PRN
  Administered 2022-05-07: 1 via TOPICAL
  Filled 2022-05-06: qty 100

## 2022-05-06 MED ORDER — ONDANSETRON HCL 4 MG PO TABS: 4.0000 mg | ORAL_TABLET | ORAL | Status: DC | PRN

## 2022-05-06 MED ORDER — KETOROLAC TROMETHAMINE 30 MG/ML IJ SOLN
30.0000 mg | Freq: Once | INTRAMUSCULAR | Status: AC
  Administered 2022-05-06: 30 mg via INTRAVENOUS

## 2022-05-06 NOTE — Discharge Summary (Signed)
Obstetrical Discharge Summary  Patient Name: Christie Nguyen DOB: 2001-09-16 MRN: [id number]  Date of Admission: 05/06/2022 Date of Delivery: 05/06/22 Date of Discharge: 05/08/2022  Primary OB: none- unassigned LMP:No LMP recorded. Patient is pregnant. EDC  None noted. Gestational Age at Delivery: term by newborn eval  Antepartum complications:  No prenatal care Previous  Admitting Diagnosis: precipitous delivery, no PNC Secondary Diagnosis: 2nd deg lac  Patient Active Problem List   Diagnosis Date Noted   NSVD (normal spontaneous vaginal delivery) 05/08/2022   Acute blood loss anemia 05/08/2022   No prenatal care in current pregnancy in third trimester 05/06/2022   Supervision of high risk pregnancy, antepartum 08/27/2020    Augmentation: N/A Complications: None Intrapartum complications/course: pt delivered precipitously at home, see delivery note for repair.  Date of Delivery: 05/06/22 Delivered By: unattended Delivery Type: spontaneous vaginal delivery Anesthesia: local for repair Placenta: spontaneous Laceration: 2nd deg lac Episiotomy: none Newborn Data: Live born female  Birth Weight: 7 lb 6.5 oz (3360 g) APGAR: , 9  Newborn Delivery   Birth date/time: 05/06/2022 18:31:00 Delivery type: SVD     Postpartum Procedures: started on Procardia  Edinburgh:     05/07/2022   12:13 AM  Edinburgh Postnatal Depression Scale Screening Tool  I have been able to laugh and see the funny side of things. 0  I have looked forward with enjoyment to things. 0  I have blamed myself unnecessarily when things went wrong. 2  I have been anxious or worried for no good reason. 0  I have felt scared or panicky for no good reason. 0  Things have been getting on top of me. 1  I have been so unhappy that I have had difficulty sleeping. 0  I have felt sad or miserable. 1  I have been so unhappy that I have been crying. 1  The thought of harming myself has occurred to me. 0   Edinburgh Postnatal Depression Scale Total 5      Post partum course:  Patient had an uncomplicated postpartum course.  By time of discharge on PPD#2, her pain was controlled on oral pain medications; she had appropriate lochia and was ambulating, voiding without difficulty and tolerating regular diet.  She was deemed stable for discharge to home.    Discharge Physical Exam:  BP 132/90 (BP Location: Right Arm)   Pulse 87   Temp 98.9 F (37.2 C) (Oral)   Resp 20   SpO2 99%   General: NAD CV: RRR Pulm: CTABL, nl effort ABD: s/nd/nt, fundus firm and below the umbilicus Lochia: mild Perineum: well approximated DVT Evaluation: LE non-ttp, no evidence of DVT on exam.  Hemoglobin  Date Value Ref Range Status  05/07/2022 9.5 (L) 12.0 - 15.0 g/dL Final   HCT  Date Value Ref Range Status  05/07/2022 28.9 (L) 36.0 - 46.0 % Final     Disposition: stable, discharge to home. Baby Feeding: formula Baby Disposition: home with mom  Rh Immune globulin given: n/a Rubella vaccine given: Immune Varicella vaccine given: Immune Tdap vaccine given in AP or PP setting: Offered PP Flu vaccine given in AP or PP setting:   Contraception: TBD  Prenatal Labs:  Blood type/Rh O pos  Antibody screen neg  Rubella Immune  Varicella Immune  RPR NR  HBsAg NR  HIV  Neg  GC Neg  Chlamydia Neg  Genetic screening Not done  1 hour GTT  Not done  3 hour GTT   GBS  Not done  Plan:  Christie Nguyen was discharged to home in good condition. Follow-up appointment with delivering provider in 6 weeks.  Discharge Medications: Allergies as of 05/08/2022   No Known Allergies      Medication List     STOP taking these medications    famotidine 20 MG tablet Commonly known as: Pepcid   ondansetron 8 MG disintegrating tablet Commonly known as: ZOFRAN-ODT       TAKE these medications    acetaminophen 325 MG tablet Commonly known as: Tylenol Take 2 tablets (650 mg total) by mouth  every 4 (four) hours as needed (for pain scale < 4).   ibuprofen 600 MG tablet Commonly known as: ADVIL Take 1 tablet (600 mg total) by mouth every 6 (six) hours.   NIFEdipine 60 MG 24 hr tablet Commonly known as: ADALAT CC Take 1 tablet (60 mg total) by mouth daily.   sucralfate 1 g tablet Commonly known as: Carafate Take 1 tablet (1 g total) by mouth 4 (four) times daily.         Follow-up Information     [facility] Follow up in 2 week(s).   Why: PP mood check and Nexplanon removal. Contact information: [address] [place] [address] [phone]        [facility] Follow up on 05/11/2022.   Why: blood pressure check Contact information: [address] [place] [address] [phone]                Signed:  Cyril Mourning, CNM 05/08/2022  9:26 AM

## 2022-05-06 NOTE — H&P (Signed)
OB History & Physical   History of Present Illness:  Chief Complaint: delivery at home  HPI:  Christie Nguyen is a 21 y.o. G2P0 female at Unknown gestation. She presents to L&D for precipitious out of hospital birth. Pt was unaware that she was pregnant, has had Nexplanon in place since approximately June 2022. She had some bright red bleeding this evening, then felt painful UCs and sudden urge to push.  [facility] brought pt to hospital, delivery at [phone].   Pregnancy Issues: 1. Prior elevated BP with last delivery 2022. 2. No prenatal care this pregnancy.    Maternal Medical History:   Past Medical History:  Diagnosis Date   Heart murmur     Past Surgical History:  Procedure Laterality Date   NO PAST SURGERIES      No Known Allergies  Prior to Admission medications   Medication Sig Start Date End Date Taking? Authorizing Provider  famotidine (PEPCID) 20 MG tablet Take 1 tablet (20 mg total) by mouth 2 (two) times daily. Patient not taking: Reported on 08/27/2020 11/29/17   Emily Filbert, MD  ondansetron (ZOFRAN-ODT) 8 MG disintegrating tablet Take 1 tablet (8 mg total) by mouth every 8 (eight) hours as needed for nausea or vomiting. 09/09/21   Dionne Bucy, MD  sucralfate (CARAFATE) 1 g tablet Take 1 tablet (1 g total) by mouth 4 (four) times daily. 11/29/17 11/29/18  Emily Filbert, MD     Prenatal care site: none  Social History: She  reports that she has never smoked. She has never used smokeless tobacco. She reports that she does not drink alcohol and does not use drugs.  Family History: family history includes Heart murmur in her father; Hypertension in her paternal grandmother.   Review of Systems: A full review of systems was performed and negative except as noted in the HPI.     Physical Exam:  Vital Signs: There were no vitals taken for this visit. Initial BP taken during fundal check 162/73  General: no acute distress.  HEENT:  normocephalic, atraumatic Heart: regular rate & rhythm.  No murmurs/rubs/gallops Lungs: clear to auscultation bilaterally, normal respiratory effort Abdomen: soft, non-tender  Extremities: non-tender, symmetric, no edema bilaterally.  DTRs: 2+  Neurologic: Alert & oriented x 3.   Pelvic: trailing membranes noted, moderate lochia with clots.   No results found for this or any previous visit (from the past 24 hour(s)).  Pertinent Results:  Prenatal Labs: No labs available   Assessment:  Christie Nguyen is a 21 y.o. G2P0 female at Unknown with precipitous home delivery.   Plan:  1. Admit to Labor & Delivery; consents reviewed and obtained  2. Routine OB: - Prenatal labs ordered - CBC, T&S, RPR on admit - UDS, CMP, P/C ratio ordered  3. See delivery note  4. Post Partum Planning: - baby appears term - Infant feeding: formula - Contraception: has Nexplanon in place, will remove in office   Randa Ngo, CNM 05/06/22 8:24 PM

## 2022-05-07 LAB — CBC
HCT: 28.9 % — ABNORMAL LOW (ref 36.0–46.0)
Hemoglobin: 9.5 g/dL — ABNORMAL LOW (ref 12.0–15.0)
MCH: 28 pg (ref 26.0–34.0)
MCHC: 32.9 g/dL (ref 30.0–36.0)
MCV: 85.3 fL (ref 80.0–100.0)
Platelets: 179 10*3/uL (ref 150–400)
RBC: 3.39 MIL/uL — ABNORMAL LOW (ref 3.87–5.11)
RDW: 13.8 % (ref 11.5–15.5)
WBC: 8.9 10*3/uL (ref 4.0–10.5)
nRBC: 0 % (ref 0.0–0.2)

## 2022-05-07 LAB — URINE DRUG SCREEN, QUALITATIVE (ARMC ONLY)
Amphetamines, Ur Screen: NOT DETECTED
Barbiturates, Ur Screen: NOT DETECTED
Benzodiazepine, Ur Scrn: NOT DETECTED
Cannabinoid 50 Ng, Ur ~~LOC~~: POSITIVE — AB
Cocaine Metabolite,Ur ~~LOC~~: NOT DETECTED
MDMA (Ecstasy)Ur Screen: NOT DETECTED
Methadone Scn, Ur: NOT DETECTED
Opiate, Ur Screen: NOT DETECTED
Phencyclidine (PCP) Ur S: NOT DETECTED
Tricyclic, Ur Screen: NOT DETECTED

## 2022-05-07 LAB — RPR: RPR Ser Ql: NONREACTIVE

## 2022-05-07 LAB — HEPATITIS B SURFACE ANTIGEN: Hepatitis B Surface Ag: NONREACTIVE

## 2022-05-07 LAB — CHLAMYDIA/NGC RT PCR (ARMC ONLY)
Chlamydia Tr: NOT DETECTED
N gonorrhoeae: NOT DETECTED

## 2022-05-07 LAB — PROTEIN / CREATININE RATIO, URINE
Creatinine, Urine: 129 mg/dL
Protein Creatinine Ratio: 0.93 mg/mg{Cre} — ABNORMAL HIGH (ref 0.00–0.15)
Total Protein, Urine: 120 mg/dL

## 2022-05-07 MED ORDER — NIFEDIPINE ER OSMOTIC RELEASE 30 MG PO TB24
60.0000 mg | ORAL_TABLET | Freq: Every day | ORAL | Status: DC
  Administered 2022-05-08: 60 mg via ORAL
  Filled 2022-05-07: qty 2

## 2022-05-07 MED ORDER — NIFEDIPINE ER OSMOTIC RELEASE 30 MG PO TB24
30.0000 mg | ORAL_TABLET | Freq: Every day | ORAL | Status: DC
  Administered 2022-05-07: 30 mg via ORAL
  Filled 2022-05-07: qty 1

## 2022-05-07 MED ORDER — NIFEDIPINE ER OSMOTIC RELEASE 30 MG PO TB24
30.0000 mg | ORAL_TABLET | Freq: Once | ORAL | Status: AC
  Administered 2022-05-07: 30 mg via ORAL
  Filled 2022-05-07: qty 1

## 2022-05-07 NOTE — Clinical Social Work Maternal (Signed)
CLINICAL SOCIAL WORK MATERNAL/CHILD NOTE  Patient Details  Name: Christie Nguyen MRN: [id number] Date of Birth: 30-Mar-2001  Date:  05/07/2022  Clinical Social Worker Initiating Note:  Doran Clay RN BSN Case Manager Date/Time: Initiated:  05/07/22/1230     Child's Name:  Christie Nguyen   Biological Parents:  Mother, Father   Need for Interpreter:  None   Reason for Referral:  Current Substance Use/Substance Use During Pregnancy  , Late or No Prenatal Care     Address:  [place] [address]    Phone number:  [phone]    Additional phone number: [phone]  Household Members/Support Persons (HM/SP):   Household Member/Support Person 1, Household Member/Support Person 2   HM/SP Name Relationship DOB or Age  HM/SP -[place] significant other 20  HM/SP -2 [name] daughter 1  HM/SP -3        HM/SP -4        HM/SP -5        HM/SP -6        HM/SP -7        HM/SP -8          Natural Supports (not living in the home):  Immediate Family   Professional Supports:     Employment: Unemployed   Type of Work:     Education:  Programmer, systems   Homebound arranged:    Museum/gallery curator Resources:  Medicaid   Other Resources:  Physicist, medical  , [place] Considerations Which May Impact Care:  NA  Strengths:  Ability to meet basic needs  , Pediatrician chosen   Psychotropic Medications:         Pediatrician:    Ambulance person List:   [facility])  [facility]      Pediatrician Fax Number:    Risk Factors/Current Problems:  Substance Use     Cognitive State:  Alert  , Able to Concentrate     Mood/Affect:  Happy  , Calm     CSW Assessment: TOC consult for drug exposed newborn, mother and baby positive for marijuana, mother also received no prenatal  care.  RNCM met with mother and baby at the bedside, baby's father sleeping in the recliner at the bedside, he did not wake up during meeting.   RNCM explained reason for consult, mother verbalizes understanding, explained that because of the positive drug test a CPS report will be made, mother verbalizes understanding.  She reports that a CPS report has been made in the past just a couple of months ago but the case is now closed.  She reports that a family member made the report because she was smoking in front of the baby and that the house was not clean.  She reports that she does not smoke in front of her daughter, she goes outside and does not let her daughter see it, she vapes regularly but reports only smoking marijuana occasionally, maybe once every couple of months. Patient reports that she did not know she was pregnant, she just had her daughter a year ago and had a birth control implant in her arm, so she got pregnant on birth control, she reports that if she had known she was pregnant she would have gotten prenatal care like she did with her daughter.  She and  her significant other live in a house in [place] with their daughter, they have 2 dogs and a cat inside the house and 2 dogs outside.  Her significant other works at [employer] in [place], she does not work and stays at home. Family is purchasing a car seat today to bring to the Nguyen.  She has everything from the first baby, bassinet, toys, and clothes.  She will make an appointment with [facility] where her other daughter and herself go.   Probably discharge home tomorrow.  CPS report made.    CSW Plan/Description:  Child Protective Service Report      Shelbie Hutching, RN 05/07/2022, 6:36 PM

## 2022-05-07 NOTE — Progress Notes (Signed)
Post Partum Day 1 Subjective: Doing well, no complaints.  Tolerating regular diet, pain with PO meds, voiding and ambulating without difficulty.  No CP SOB Fever,Chills, N/V or leg pain; denies nipple or breast pain, no HA change of vision, RUQ/epigastric pain.  She is doing skin-to-skin with her infant.  Objective: BP (!) 141/98 (BP Location: Right Arm) Comment: nurse Maddy notified  Pulse 70   Temp 98.1 F (36.7 C) (Oral)   Resp 18   SpO2 99%    Physical Exam:  General: NAD Breasts: soft/nontender CV: RRR Pulm: nl effort, CTABL Abdomen: soft, NT, BS x 4 Perineum: minimal edema, repair well approximated Lochia: moderate Uterine Fundus: fundus firm and 1 fb below umbilicus DVT Evaluation: no cords, ttp LEs   Recent Labs    05/06/22 2115 05/07/22 0608  HGB 12.1 9.5*  HCT 36.5 28.9*  WBC 12.6* 8.9  PLT 216 179    Assessment/Plan: 21 y.o. G2P0 postpartum day # 1  - Continue routine PP care - encouraged snug fitting bra and cabbage leaves for bottlefeeding.  - Discussed contraceptive options including implant, IUDs hormonal and non-hormonal, injection, pills/ring/patch, condoms, and NFP.  She is considering the Depo shot. - Discussed taking her Nexplanon out in the office in 1 week.  - Acute blood loss anemia - hemodynamically stable and asymptomatic; start po ferrous sulfate BID with stool softeners  - Start Procardia 30 mg XL for BP of 130s-140s/80s-90s with increased PCR.  - TOC to see her today for unknown pregnancy  Disposition: Does not desire Dc home today.   Janyce Llanos, CNM 05/07/2022 11:23 AM

## 2022-05-08 ENCOUNTER — Encounter: Payer: Self-pay | Admitting: Obstetrics and Gynecology

## 2022-05-08 ENCOUNTER — Other Ambulatory Visit: Payer: Self-pay

## 2022-05-08 DIAGNOSIS — D62 Acute posthemorrhagic anemia: Secondary | ICD-10-CM

## 2022-05-08 LAB — VARICELLA ZOSTER ANTIBODY, IGG: Varicella IgG: 1217 index (ref >165)

## 2022-05-08 LAB — RUBELLA SCREEN: Rubella: 1.84 index (ref >0.99)

## 2022-05-08 MED ORDER — ACETAMINOPHEN 325 MG PO TABS: 650.0000 mg | ORAL_TABLET | ORAL | Status: AC | PRN

## 2022-05-08 MED ORDER — NIFEDIPINE ER 60 MG PO TB24
60.0000 mg | ORAL_TABLET | Freq: Every day | ORAL | 11 refills | Status: AC
  Filled 2022-05-08: qty 30, 30d supply, fill #0

## 2022-05-08 MED ORDER — IBUPROFEN 600 MG PO TABS: 600.0000 mg | ORAL_TABLET | Freq: Four times a day (QID) | ORAL | Status: AC

## 2022-05-08 NOTE — Progress Notes (Signed)
Pt stated that she has a way of transportation to infant appointment on monday and to her own appointment Monday morning for BP check.

## 2022-05-08 NOTE — Progress Notes (Addendum)
Tried calling patient phone for meds to beds, no response. Notified RN to have patient pickup next time I call in 5-10 minutes. $0.00 copay. Med will be delivered shortly.

## 2022-05-08 NOTE — Discharge Instructions (Signed)
Transportation has been arranged to your appointment on Monday July 24th at 2:30 PM with [facility], provided through your [employer]  Drive will call you on arrival. They should arrive between 1:30 and 1:45 at your home.  They will pick you up from your appointment around 3pm.    [employer] [facility] [phone]-  If anything changes or the ride needs to be canceled you call the reservations line at [phone].

## 2022-05-08 NOTE — Progress Notes (Signed)
Discharge instructions given and reviewed with pt. Pt educated on follow up care, appointments and when to notify provider, questions invited and answered. ID bands matched with infant.  Pt and family noted understanding  to teaching/education.Pt escorted off unit by volunteer in wheelchair with infant safely in car seat, car seat held by mother in lap. Infant car seat secured in car by family.

## 2022-05-20 ENCOUNTER — Other Ambulatory Visit: Payer: Self-pay

## 2024-10-03 ENCOUNTER — Other Ambulatory Visit: Payer: Self-pay | Admitting: Physician Assistant

## 2024-10-03 DIAGNOSIS — H53149 Visual discomfort, unspecified: Secondary | ICD-10-CM

## 2024-10-03 DIAGNOSIS — G4486 Cervicogenic headache: Secondary | ICD-10-CM

## 2024-10-03 DIAGNOSIS — R51 Headache with orthostatic component, not elsewhere classified: Secondary | ICD-10-CM

## 2024-10-03 DIAGNOSIS — G43E09 Chronic migraine with aura, not intractable, without status migrainosus: Secondary | ICD-10-CM

## 2024-10-03 DIAGNOSIS — M542 Cervicalgia: Secondary | ICD-10-CM

## 2024-10-03 DIAGNOSIS — R519 Headache, unspecified: Secondary | ICD-10-CM

## 2024-10-06 ENCOUNTER — Ambulatory Visit
Admission: RE | Admit: 2024-10-06 | Discharge: 2024-10-06 | Attending: Physician Assistant | Admitting: Physician Assistant

## 2024-10-06 ENCOUNTER — Ambulatory Visit
Admission: RE | Admit: 2024-10-06 | Discharge: 2024-10-06 | Disposition: A | Discharge status: Home / Self Care | Source: Ambulatory Visit | Attending: Physician Assistant | Admitting: Physician Assistant

## 2024-10-06 DIAGNOSIS — R51 Headache with orthostatic component, not elsewhere classified: Secondary | ICD-10-CM | POA: Diagnosis present

## 2024-10-06 DIAGNOSIS — R519 Headache, unspecified: Secondary | ICD-10-CM | POA: Diagnosis present

## 2024-10-06 DIAGNOSIS — G43E09 Chronic migraine with aura, not intractable, without status migrainosus: Secondary | ICD-10-CM | POA: Insufficient documentation

## 2024-10-06 DIAGNOSIS — G4486 Cervicogenic headache: Secondary | ICD-10-CM | POA: Diagnosis present

## 2024-10-06 DIAGNOSIS — M542 Cervicalgia: Secondary | ICD-10-CM | POA: Insufficient documentation

## 2024-10-06 DIAGNOSIS — H53149 Visual discomfort, unspecified: Secondary | ICD-10-CM | POA: Insufficient documentation
